# Patient Record
Sex: Female | Born: 1965
Health system: Southern US, Community
[De-identification: ages and names within clinical notes are randomized; demographics above are authoritative.]

## PROBLEM LIST (undated history)

## (undated) DIAGNOSIS — I10 Essential (primary) hypertension: Secondary | ICD-10-CM

## (undated) DIAGNOSIS — E559 Vitamin D deficiency, unspecified: Secondary | ICD-10-CM

## (undated) DIAGNOSIS — F430 Acute stress reaction: Secondary | ICD-10-CM

## (undated) DIAGNOSIS — K5730 Diverticulosis of large intestine without perforation or abscess without bleeding: Secondary | ICD-10-CM

## (undated) DIAGNOSIS — F419 Anxiety disorder, unspecified: Secondary | ICD-10-CM

## (undated) DIAGNOSIS — E7800 Pure hypercholesterolemia, unspecified: Secondary | ICD-10-CM

## (undated) DIAGNOSIS — M5430 Sciatica, unspecified side: Secondary | ICD-10-CM

## (undated) HISTORY — DX: Pure hypercholesterolemia, unspecified: E78.00

## (undated) HISTORY — DX: Vitamin D deficiency, unspecified: E55.9

## (undated) HISTORY — DX: Sciatica, unspecified side: M54.30

## (undated) HISTORY — DX: Acute stress reaction: F43.0

## (undated) HISTORY — DX: Essential (primary) hypertension: I10

## (undated) HISTORY — DX: Diverticulosis of large intestine without perforation or abscess without bleeding: K57.30

## (undated) HISTORY — DX: Anxiety disorder, unspecified: F41.9

---

## 2014-03-24 ENCOUNTER — Other Ambulatory Visit: Payer: Self-pay | Admitting: Obstetrics and Gynecology

## 2014-03-24 DIAGNOSIS — Z1231 Encounter for screening mammogram for malignant neoplasm of breast: Secondary | ICD-10-CM

## 2014-04-21 ENCOUNTER — Encounter (INDEPENDENT_AMBULATORY_CARE_PROVIDER_SITE_OTHER): Payer: Self-pay

## 2014-04-21 ENCOUNTER — Ambulatory Visit
Admission: RE | Admit: 2014-04-21 | Discharge: 2014-04-21 | Disposition: A | Discharge status: Home / Self Care | Payer: BC Managed Care – PPO | Source: Ambulatory Visit | Attending: Obstetrics and Gynecology | Admitting: Obstetrics and Gynecology

## 2014-04-21 DIAGNOSIS — Z1231 Encounter for screening mammogram for malignant neoplasm of breast: Secondary | ICD-10-CM

## 2017-08-22 DIAGNOSIS — D3132 Benign neoplasm of left choroid: Secondary | ICD-10-CM | POA: Diagnosis not present

## 2017-10-10 DIAGNOSIS — Z719 Counseling, unspecified: Secondary | ICD-10-CM | POA: Diagnosis not present

## 2018-01-20 DIAGNOSIS — Z719 Counseling, unspecified: Secondary | ICD-10-CM | POA: Diagnosis not present

## 2018-01-27 DIAGNOSIS — Z719 Counseling, unspecified: Secondary | ICD-10-CM | POA: Diagnosis not present

## 2018-02-03 DIAGNOSIS — Z719 Counseling, unspecified: Secondary | ICD-10-CM | POA: Diagnosis not present

## 2018-04-03 ENCOUNTER — Other Ambulatory Visit: Payer: Self-pay | Admitting: Family Medicine

## 2018-04-03 DIAGNOSIS — E559 Vitamin D deficiency, unspecified: Secondary | ICD-10-CM | POA: Diagnosis not present

## 2018-04-03 DIAGNOSIS — Z1231 Encounter for screening mammogram for malignant neoplasm of breast: Secondary | ICD-10-CM

## 2018-04-03 DIAGNOSIS — Z0000 Encounter for general adult medical examination without abnormal findings: Secondary | ICD-10-CM | POA: Diagnosis not present

## 2018-04-03 DIAGNOSIS — I10 Essential (primary) hypertension: Secondary | ICD-10-CM | POA: Diagnosis not present

## 2018-04-20 DIAGNOSIS — M545 Low back pain: Secondary | ICD-10-CM | POA: Diagnosis not present

## 2018-04-20 DIAGNOSIS — M79605 Pain in left leg: Secondary | ICD-10-CM | POA: Diagnosis not present

## 2018-04-20 DIAGNOSIS — M6281 Muscle weakness (generalized): Secondary | ICD-10-CM | POA: Diagnosis not present

## 2018-04-22 DIAGNOSIS — M79605 Pain in left leg: Secondary | ICD-10-CM | POA: Diagnosis not present

## 2018-04-22 DIAGNOSIS — M545 Low back pain: Secondary | ICD-10-CM | POA: Diagnosis not present

## 2018-04-22 DIAGNOSIS — M6281 Muscle weakness (generalized): Secondary | ICD-10-CM | POA: Diagnosis not present

## 2018-04-29 DIAGNOSIS — M545 Low back pain: Secondary | ICD-10-CM | POA: Diagnosis not present

## 2018-04-29 DIAGNOSIS — M6281 Muscle weakness (generalized): Secondary | ICD-10-CM | POA: Diagnosis not present

## 2018-04-29 DIAGNOSIS — M79605 Pain in left leg: Secondary | ICD-10-CM | POA: Diagnosis not present

## 2018-05-04 ENCOUNTER — Ambulatory Visit
Admission: RE | Admit: 2018-05-04 | Discharge: 2018-05-04 | Disposition: A | Discharge status: Home / Self Care | Payer: 59 | Source: Ambulatory Visit | Attending: Family Medicine | Admitting: Family Medicine

## 2018-05-04 DIAGNOSIS — Z1231 Encounter for screening mammogram for malignant neoplasm of breast: Secondary | ICD-10-CM | POA: Diagnosis not present

## 2018-05-06 DIAGNOSIS — M6281 Muscle weakness (generalized): Secondary | ICD-10-CM | POA: Diagnosis not present

## 2018-05-06 DIAGNOSIS — M79605 Pain in left leg: Secondary | ICD-10-CM | POA: Diagnosis not present

## 2018-05-06 DIAGNOSIS — M545 Low back pain: Secondary | ICD-10-CM | POA: Diagnosis not present

## 2018-05-08 DIAGNOSIS — M79605 Pain in left leg: Secondary | ICD-10-CM | POA: Diagnosis not present

## 2018-05-08 DIAGNOSIS — M545 Low back pain: Secondary | ICD-10-CM | POA: Diagnosis not present

## 2018-05-08 DIAGNOSIS — M6281 Muscle weakness (generalized): Secondary | ICD-10-CM | POA: Diagnosis not present

## 2018-10-20 DIAGNOSIS — M5432 Sciatica, left side: Secondary | ICD-10-CM | POA: Diagnosis not present

## 2018-10-20 DIAGNOSIS — M9903 Segmental and somatic dysfunction of lumbar region: Secondary | ICD-10-CM | POA: Diagnosis not present

## 2018-10-21 DIAGNOSIS — M9903 Segmental and somatic dysfunction of lumbar region: Secondary | ICD-10-CM | POA: Diagnosis not present

## 2018-10-21 DIAGNOSIS — M5432 Sciatica, left side: Secondary | ICD-10-CM | POA: Diagnosis not present

## 2018-10-26 DIAGNOSIS — M5432 Sciatica, left side: Secondary | ICD-10-CM | POA: Diagnosis not present

## 2018-10-26 DIAGNOSIS — M9903 Segmental and somatic dysfunction of lumbar region: Secondary | ICD-10-CM | POA: Diagnosis not present

## 2018-10-28 DIAGNOSIS — M5432 Sciatica, left side: Secondary | ICD-10-CM | POA: Diagnosis not present

## 2018-10-28 DIAGNOSIS — M9903 Segmental and somatic dysfunction of lumbar region: Secondary | ICD-10-CM | POA: Diagnosis not present

## 2018-10-29 DIAGNOSIS — M9903 Segmental and somatic dysfunction of lumbar region: Secondary | ICD-10-CM | POA: Diagnosis not present

## 2018-10-29 DIAGNOSIS — M5432 Sciatica, left side: Secondary | ICD-10-CM | POA: Diagnosis not present

## 2019-05-24 ENCOUNTER — Other Ambulatory Visit: Payer: Self-pay | Admitting: Family Medicine

## 2019-05-24 DIAGNOSIS — Z1231 Encounter for screening mammogram for malignant neoplasm of breast: Secondary | ICD-10-CM

## 2019-07-06 ENCOUNTER — Other Ambulatory Visit: Payer: Self-pay

## 2019-07-06 ENCOUNTER — Ambulatory Visit
Admission: RE | Admit: 2019-07-06 | Discharge: 2019-07-06 | Disposition: A | Discharge status: Home / Self Care | Payer: 59 | Source: Ambulatory Visit | Attending: Family Medicine | Admitting: Family Medicine

## 2019-07-06 DIAGNOSIS — Z1231 Encounter for screening mammogram for malignant neoplasm of breast: Secondary | ICD-10-CM

## 2019-07-20 ENCOUNTER — Other Ambulatory Visit: Payer: Self-pay | Admitting: Nurse Practitioner

## 2019-07-20 ENCOUNTER — Ambulatory Visit
Admission: RE | Admit: 2019-07-20 | Discharge: 2019-07-20 | Disposition: A | Discharge status: Home / Self Care | Payer: No Typology Code available for payment source | Source: Ambulatory Visit | Attending: Nurse Practitioner | Admitting: Nurse Practitioner

## 2019-07-20 ENCOUNTER — Other Ambulatory Visit: Payer: Self-pay

## 2019-07-20 DIAGNOSIS — R609 Edema, unspecified: Secondary | ICD-10-CM

## 2019-07-20 DIAGNOSIS — R52 Pain, unspecified: Secondary | ICD-10-CM

## 2019-07-20 DIAGNOSIS — W19XXXA Unspecified fall, initial encounter: Secondary | ICD-10-CM

## 2019-09-08 ENCOUNTER — Ambulatory Visit: Payer: 59 | Attending: Internal Medicine

## 2019-09-08 DIAGNOSIS — Z20822 Contact with and (suspected) exposure to covid-19: Secondary | ICD-10-CM

## 2019-09-09 LAB — NOVEL CORONAVIRUS, NAA: SARS-CoV-2, NAA: NOT DETECTED

## 2021-06-09 IMAGING — CR DG KNEE COMPLETE 4+V*L*
4 series · 4 of 4 positions shown · non-contrast
Comparison: None.

CLINICAL DATA: Recent fall with left knee pain, initial encounter

EXAM:
LEFT KNEE - COMPLETE 4+ VIEW

[w knee ap left]
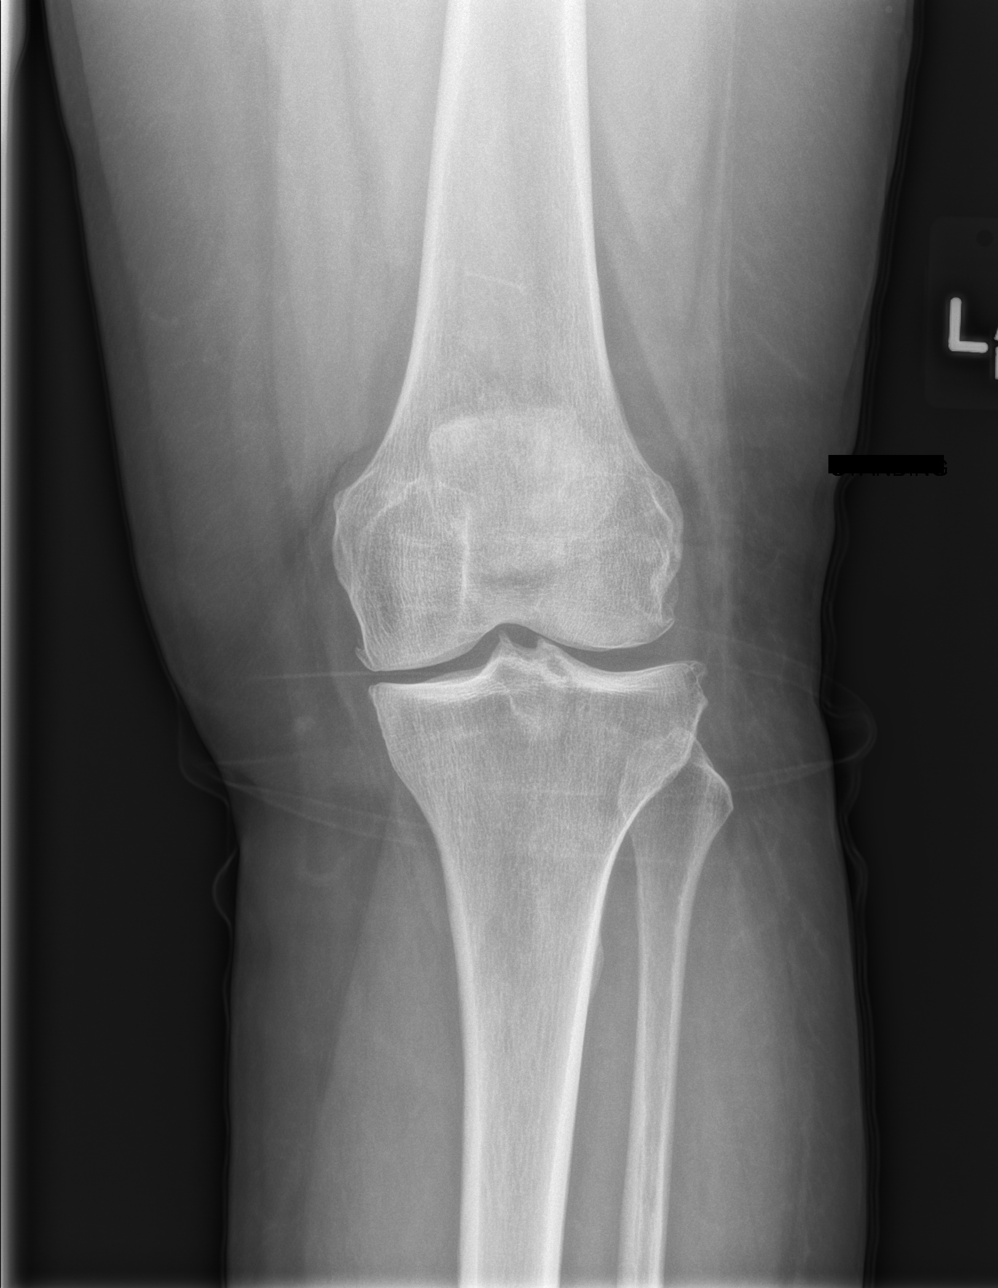

[w knee lat left (1 of 2)]
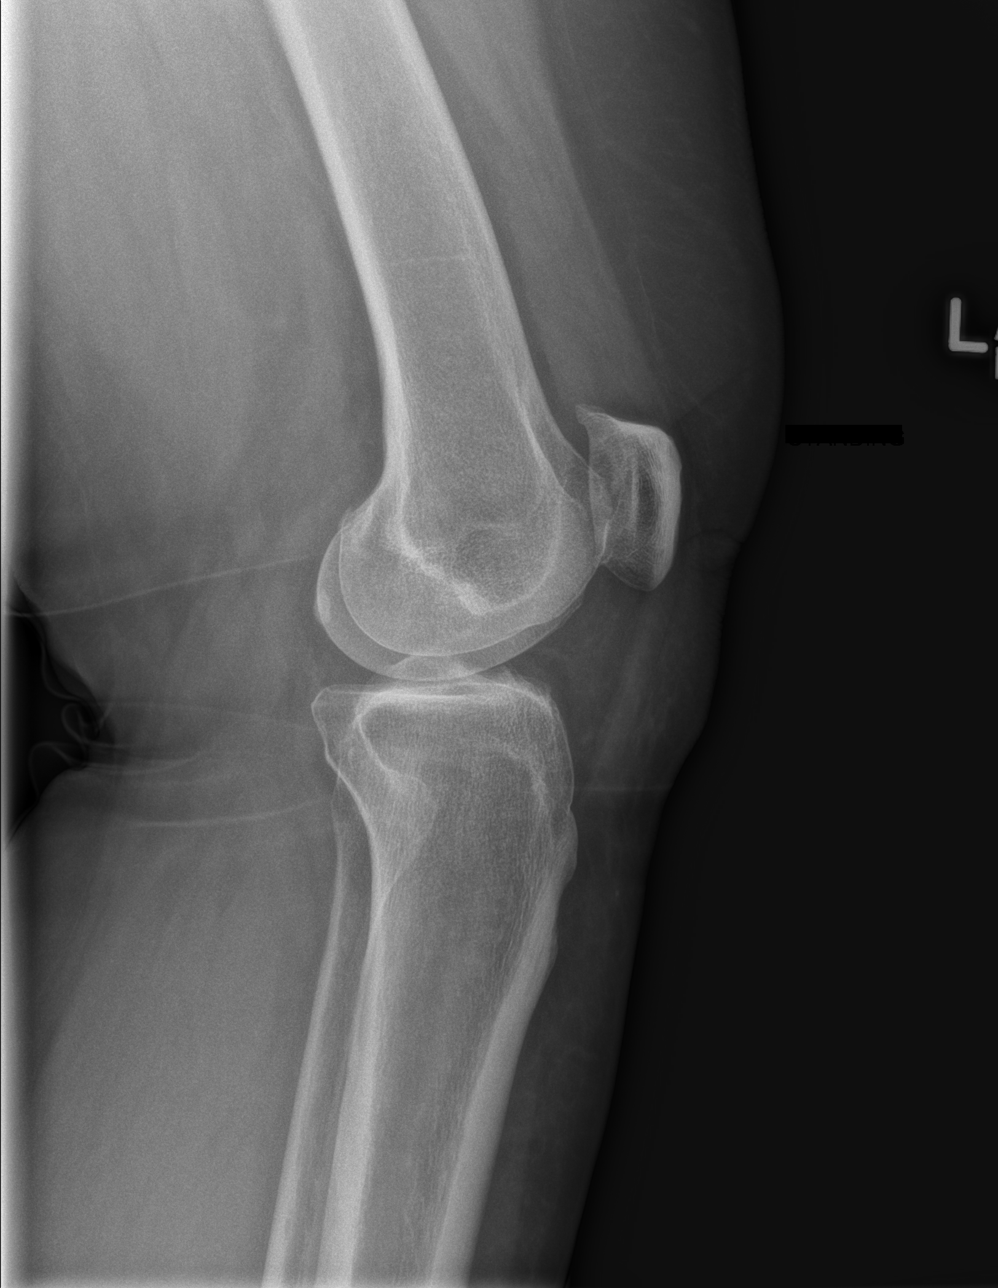

[w knee lat left (2 of 2)]
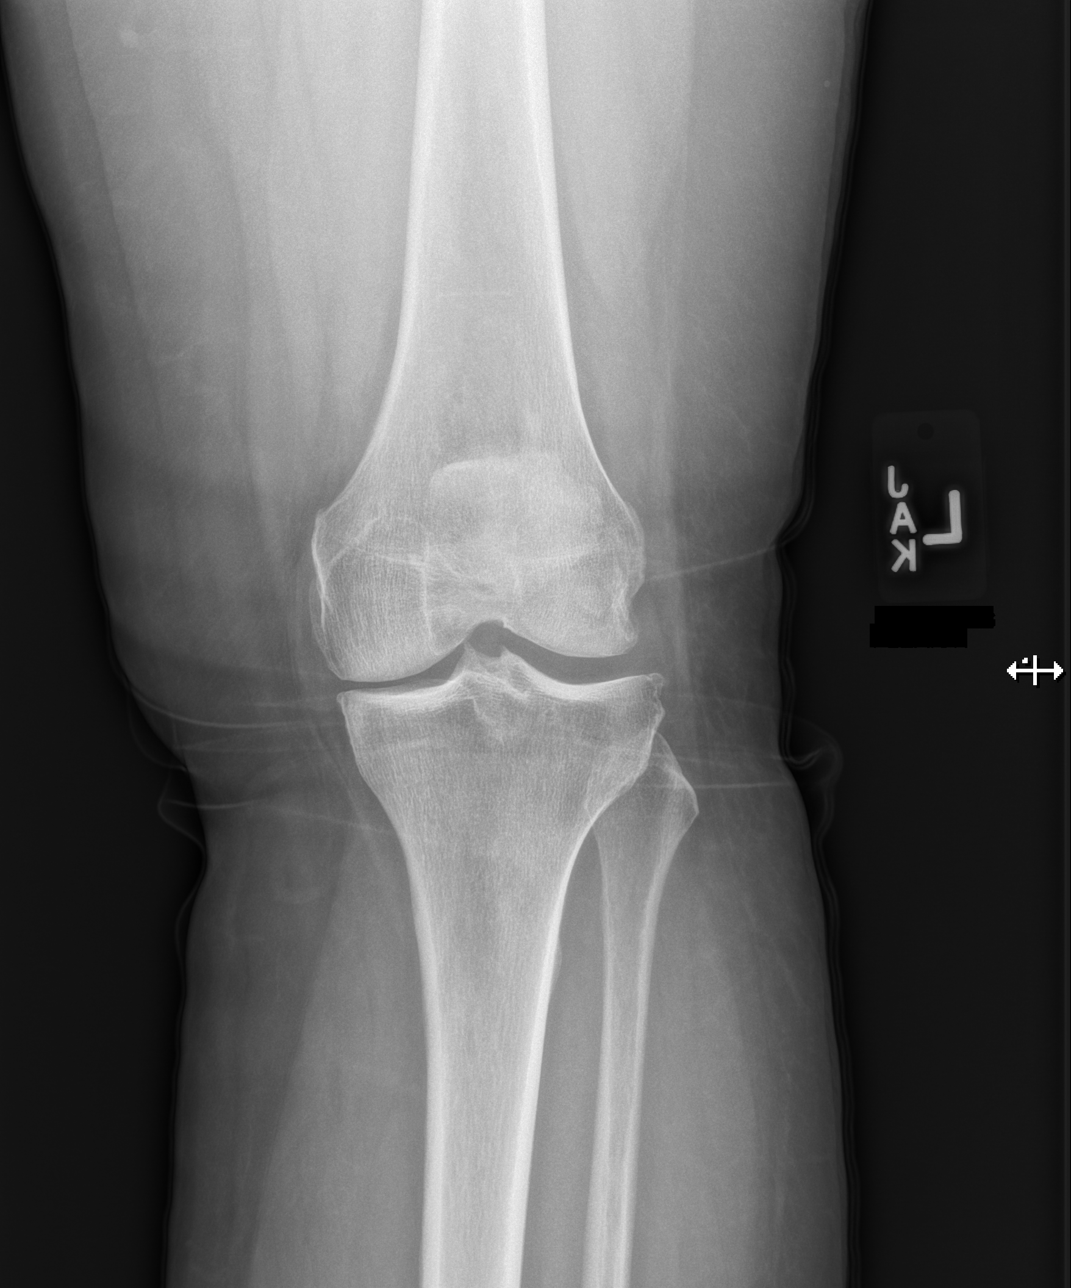

[x knee sunrise left]
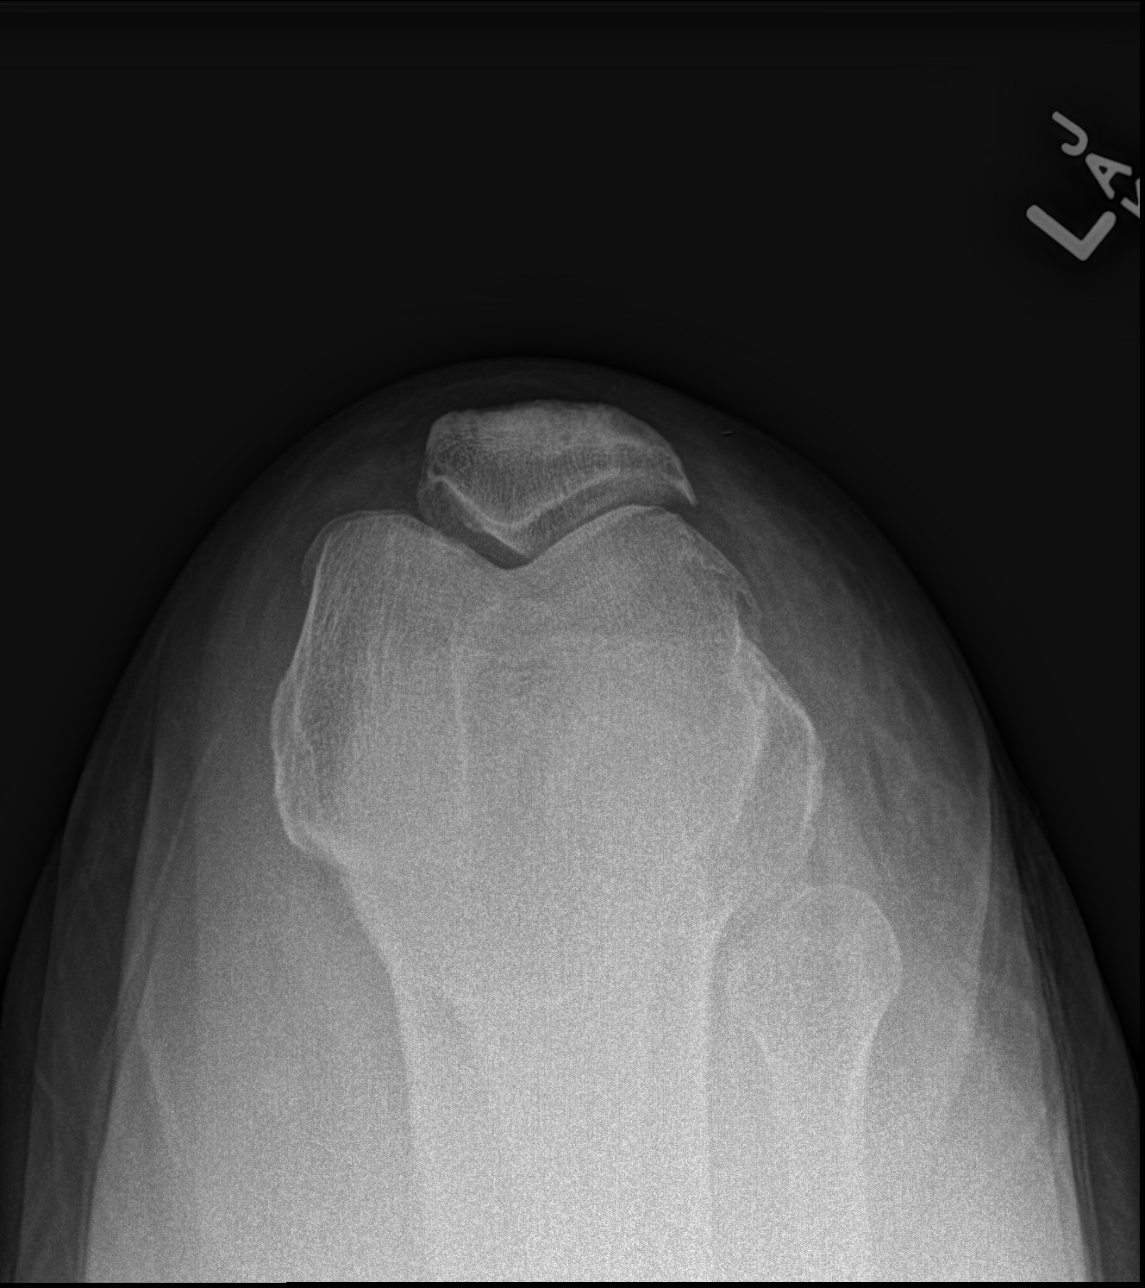

[4 of 4 positions shown; findings below may reference images not displayed]

FINDINGS: Mild tricompartmental degenerative changes are noted worst in the
medial joint space. No acute fracture or dislocation is seen. No
soft tissue abnormality is noted.
IMPRESSION: Mild degenerative change without acute abnormality.

## 2021-09-03 NOTE — Progress Notes (Signed)
Cardiology Office Note:    Date:  09/05/2021   ID:  Nancy Chan, DOB 1966/07/13, MRN [id number]  PCP:  Nancy Arabian, MD   [facility] Providers Cardiologist:  Werner Lean, MD     Referring MD: Nancy Arabian, MD   CC: questions about BP Consulted for the evaluation of HTN at the behest of Nancy Arabian, MD  History of Present Illness:    Nancy Chan is a 56 y.o. female with a hx of HTN and HLD Seen 09/05/21.  Patient notes that she is feeling ok.  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  Is renovating her house fixing the kitchen  and bathroom.  Has had no chest pain, chest pressure, chest tightness, chest stinging . No shortness of breath, but DOE with stairs.  No PND or orthopnea.  Notes weight gain, leg swelling , and abdominal swelling.  No syncope or near syncope . Notes no palpitations or funny heart beats.     Attempting to drink more water, decrease salt, and fatty foods. Made these changes in the last 6 months. Patient reports prior cardiac testing including   No history of pre-eclampsia, gestation HTN or gestational DM.   Ambulatory BP 127/77  on new medications.   Past Medical History:  Diagnosis Date   Anxiety    Diverticulosis of colon    HTN (hypertension)    Hypercholesterolemia    Sciatica    Stress reaction    Vitamin D deficiency     No past surgical history on file.  Current Medications: Current Meds  Medication Sig   amLODipine (NORVASC) 5 MG tablet Take 5 mg by mouth daily.   atorvastatin (LIPITOR) 10 MG tablet Take 10 mg by mouth daily.   ECHINACEA COMPLEX PO Take by mouth.   Milk Thistle 1000 MG CAPS daily.   valsartan-hydrochlorothiazide (DIOVAN-HCT) 320-25 MG tablet Take 1 tablet by mouth daily.     Allergies:   Rosuvastatin   Social History   Socioeconomic History   Marital status: Single    Spouse name: Not on file   Number of children: Not on file   Years of education: Not on file    Highest education level: Not on file  Occupational History   Not on file  Tobacco Use   Smoking status: Never   Smokeless tobacco: Never  Substance and Sexual Activity   Alcohol use: Yes   Drug use: Never   Sexual activity: Not on file  Other Topics Concern   Not on file  Social History Narrative   Not on file   Social Determinants of Health   Financial Resource Strain: Not on file  Food Insecurity: Not on file  Transportation Needs: Not on file  Physical Activity: Not on file  Stress: Not on file  Social Connections: Not on file     Family History: The patient's family history includes Heart disease (age of onset: 71) in her father; Heart failure in her mother. History of coronary artery disease notable for no members . History of heart failure notable for father.   ROS:   Please see the history of present illness.     All other systems reviewed and are negative.  EKGs/Labs/Other Studies Reviewed:    The following studies were reviewed today:   EKG:  EKG is  ordered today.  The ekg ordered today demonstrates  09/05/21: SR rate 80 LVH  Recent Labs: No results found for requested labs within last  8760 hours.  Recent Lipid Panel No results found for: CHOL, TRIG, HDL, CHOLHDL, VLDL, LDLCALC, LDLDIRECT       Physical Exam:    VS:  BP (!) 151/100    Pulse 80    Ht 5\' 7"  (1.702 m)    Wt 88.9 kg    SpO2 98%    BMI 30.70 kg/m     Wt Readings from Last 3 Encounters:  09/05/21 88.9 kg     Gen: no distress,    Neck: No JVD  Cardiac: No Rubs or Gallops, Systolic Murmur worse with standing, regular rate +2 radial pulses Respiratory: Clear to auscultation bilaterally, normal effort, normal  respiratory rate GI: Soft, nontender, non-distended  MS: No  edema;  moves all extremities Integument: Skin feels warm Neuro:  At time of evaluation, alert and oriented to person/place/time/situation  Psych: Normal affect, patient feels ok   ASSESSMENT:    1. Hypertension,  unspecified type   2. LVH (left ventricular hypertrophy)   3. Bilateral lower extremity edema   4. Familial hypercholesterolemia    PLAN:    Hypertension Systolic murmur and LVH Daytime somnolence STOPBANG 4 FH - Aldactone/Renin - Sleep study - Plasma metanephrines - TSH was normal - continue current medications, and amb BP monitoring, next medication would be aldactone 25 mg PO daily - has follow up labs on atorvastatin but will need higher statin does (had rosuvastatin myalgias) - getting echo  Three months f/u me or APP       Medication Adjustments/Labs and Tests Ordered: Current medicines are reviewed at length with the patient today.  Concerns regarding medicines are outlined above.  Orders Placed This Encounter  Procedures   Aldosterone + renin activity w/ ratio   Metanephrines, plasma   EKG 12-Lead   ECHOCARDIOGRAM COMPLETE   [place] Sleep Study   No orders of the defined types were placed in this encounter.   Patient Instructions  Medication Instructions:  Your physician recommends that you continue on your current medications as directed. Please refer to the Current Medication list given to you today.  *If you need a refill on your cardiac medications before your next appointment, please call your pharmacy*   Lab Work: TODAY: aldosterone + renin   If you have labs (blood work) drawn today and your tests are completely normal, you will receive your results only by: [place] (if you have MyChart) OR A paper copy in the mail If you have any lab test that is abnormal or we need to change your treatment, we will call you to review the results.   Testing/Procedures: Your physician has recommended that you have a home sleep study. This test records several body functions during sleep, including: brain activity, eye movement, oxygen and carbon dioxide blood levels, heart rate and rhythm, breathing rate and rhythm, the flow of air through your mouth and  nose, snoring, body muscle movements, and chest and belly movement.   Your physician has requested that you have an echocardiogram. Echocardiography is a painless test that uses sound waves to create images of your heart. It provides your doctor with information about the size and shape of your heart and how well your hearts chambers and valves are working. This procedure takes approximately one hour. There are no restrictions for this procedure.    Follow-Up: At [facility], you and your health needs are our priority.  As part of our continuing mission to provide you with exceptional heart care, we have created designated  Provider Care Teams.  These Care Teams include your primary Cardiologist (physician) and Advanced Practice Providers (APPs -  Physician Assistants and Nurse Practitioners) who all work together to provide you with the care you need, when you need it.   Your next appointment:   3- 4  month(s)  The format for your next appointment:   In Person  Provider:   Werner Lean, MD {      Signed, Werner Lean, MD  09/05/2021 5:18 PM    [place]

## 2021-09-05 ENCOUNTER — Ambulatory Visit: Payer: 59 | Admitting: Internal Medicine

## 2021-09-05 ENCOUNTER — Telehealth: Payer: Self-pay | Admitting: *Deleted

## 2021-09-05 ENCOUNTER — Encounter: Payer: Self-pay | Admitting: Internal Medicine

## 2021-09-05 ENCOUNTER — Other Ambulatory Visit: Payer: Self-pay

## 2021-09-05 VITALS — BP 151/100 | HR 80 | Ht 67.0 in | Wt 196.0 lb

## 2021-09-05 DIAGNOSIS — I10 Essential (primary) hypertension: Secondary | ICD-10-CM | POA: Insufficient documentation

## 2021-09-05 DIAGNOSIS — I517 Cardiomegaly: Secondary | ICD-10-CM | POA: Diagnosis not present

## 2021-09-05 DIAGNOSIS — R600 Localized edema: Secondary | ICD-10-CM | POA: Diagnosis not present

## 2021-09-05 DIAGNOSIS — E7801 Familial hypercholesterolemia: Secondary | ICD-10-CM

## 2021-09-05 NOTE — Telephone Encounter (Signed)
Pt was in the office today to see Dr. Gasper Sells who ordered an [place] sleep study for the pt. I have set pt up sleep study and have registered the pt on sleep site. Pt aware once study completed and ready by the cardiologist we will call with results. Pt aware I will call with PIN# once I have authorization from insurance company. Pt thanked me for the help today.

## 2021-09-05 NOTE — Patient Instructions (Addendum)
Medication Instructions:  Your physician recommends that you continue on your current medications as directed. Please refer to the Current Medication list given to you today.  *If you need a refill on your cardiac medications before your next appointment, please call your pharmacy*   Lab Work: TODAY: aldosterone + renin   If you have labs (blood work) drawn today and your tests are completely normal, you will receive your results only by: [place] (if you have MyChart) OR A paper copy in the mail If you have any lab test that is abnormal or we need to change your treatment, we will call you to review the results.   Testing/Procedures: Your physician has recommended that you have a home sleep study. This test records several body functions during sleep, including: brain activity, eye movement, oxygen and carbon dioxide blood levels, heart rate and rhythm, breathing rate and rhythm, the flow of air through your mouth and nose, snoring, body muscle movements, and chest and belly movement.   Your physician has requested that you have an echocardiogram. Echocardiography is a painless test that uses sound waves to create images of your heart. It provides your doctor with information about the size and shape of your heart and how well your hearts chambers and valves are working. This procedure takes approximately one hour. There are no restrictions for this procedure.    Follow-Up: At [facility], you and your health needs are our priority.  As part of our continuing mission to provide you with exceptional heart care, we have created designated Provider Care Teams.  These Care Teams include your primary Cardiologist (physician) and Advanced Practice Providers (APPs -  Physician Assistants and Nurse Practitioners) who all work together to provide you with the care you need, when you need it.   Your next appointment:   3- 4  month(s)  The format for your next appointment:   In  Person  Provider:   Werner Lean, MD {

## 2021-09-10 LAB — ALDOSTERONE + RENIN ACTIVITY W/ RATIO
ALDOS/RENIN RATIO: 1.2 (ref 0.0–30.0)
ALDOSTERONE: 8.3 ng/dL (ref 0.0–30.0)
Renin: 7.025 ng/mL/hr — ABNORMAL HIGH (ref 0.167–5.380)

## 2021-09-12 ENCOUNTER — Telehealth: Payer: Self-pay | Admitting: Internal Medicine

## 2021-09-12 NOTE — Telephone Encounter (Signed)
Follow Up:      Patient is  returning Nancy Chan's  call from today, concerning her lab results.

## 2021-09-14 NOTE — Telephone Encounter (Signed)
Left a message to call back.

## 2021-09-17 ENCOUNTER — Ambulatory Visit (HOSPITAL_COMMUNITY): Payer: 59 | Attending: Cardiology

## 2021-09-17 ENCOUNTER — Other Ambulatory Visit: Payer: Self-pay

## 2021-09-17 DIAGNOSIS — I10 Essential (primary) hypertension: Secondary | ICD-10-CM | POA: Diagnosis not present

## 2021-09-17 DIAGNOSIS — R600 Localized edema: Secondary | ICD-10-CM | POA: Insufficient documentation

## 2021-09-17 DIAGNOSIS — I517 Cardiomegaly: Secondary | ICD-10-CM | POA: Diagnosis not present

## 2021-09-17 LAB — ECHOCARDIOGRAM COMPLETE
Area-P 1/2: 2.64 cm2
S' Lateral: 3.4 cm

## 2021-09-18 ENCOUNTER — Telehealth: Payer: Self-pay

## 2021-09-18 DIAGNOSIS — I10 Essential (primary) hypertension: Secondary | ICD-10-CM

## 2021-09-18 DIAGNOSIS — R600 Localized edema: Secondary | ICD-10-CM

## 2021-09-18 MED ORDER — SPIRONOLACTONE 25 MG PO TABS: 25.0000 mg | ORAL_TABLET | Freq: Every day | ORAL | 3 refills | Status: AC

## 2021-09-18 NOTE — Telephone Encounter (Signed)
Pt has been called results and MD recommendations reviewed.  Please see encounter from today 09/18/21.

## 2021-09-18 NOTE — Telephone Encounter (Signed)
Called pt reviewed results and MD recommendations.  Pt is agreeable to plan orders placed. Pt advised that renal artery duplex will be done at our [place] location and a scheduler will call to get appointment set up.  Pt had no questions or concerns.

## 2021-09-18 NOTE — Telephone Encounter (Signed)
-----   Message from Werner Lean, MD sent at 09/11/2021  5:07 PM EST ----- Results: Elevated renin with normal aldo/renin level Plan: Start aldactone 25 mg PO daily Labs in 7-10 days Bilateral renal artery duplex  Werner Lean, MD

## 2021-09-19 NOTE — Telephone Encounter (Signed)
Prior Authorization is not required for the requested services

## 2021-09-19 NOTE — Telephone Encounter (Signed)
Called the pt to give her the PIN # and ok to proceed with sleep study. Left message for the pt to call the office and ask to s/w [facility]/Home Sleep Studies.

## 2021-09-20 NOTE — Telephone Encounter (Signed)
Left message x 2 to give the pt the PIN#

## 2021-09-21 NOTE — Telephone Encounter (Signed)
Called and made the patient aware that she may proceed with the [facility] Sleep Study. PIN # [id number] provided to the patient. Patient made aware that she will be contacted after the test has been read with the results and any recommendations. Patient verbalized understanding and thanked me for the call.    Pt states she will do sleep study with in the next few days. Pt thanked me for the call today.

## 2021-10-02 ENCOUNTER — Other Ambulatory Visit: Payer: Self-pay

## 2021-10-02 ENCOUNTER — Other Ambulatory Visit: Payer: 59 | Admitting: *Deleted

## 2021-10-02 DIAGNOSIS — I10 Essential (primary) hypertension: Secondary | ICD-10-CM

## 2021-10-02 DIAGNOSIS — R600 Localized edema: Secondary | ICD-10-CM

## 2021-10-02 LAB — BASIC METABOLIC PANEL
BUN/Creatinine Ratio: 28 — ABNORMAL HIGH (ref 9–23)
BUN: 28 mg/dL — ABNORMAL HIGH (ref 6–24)
CO2: 26 mmol/L (ref 20–29)
Calcium: 9.9 mg/dL (ref 8.7–10.2)
Chloride: 100 mmol/L (ref 96–106)
Creatinine, Ser: 0.99 mg/dL (ref 0.57–1.00)
Glucose: 93 mg/dL (ref 70–99)
Potassium: 4.7 mmol/L (ref 3.5–5.2)
Sodium: 138 mmol/L (ref 134–144)
eGFR: 67 mL/min/{1.73_m2} (ref >59)

## 2021-10-03 ENCOUNTER — Encounter: Payer: Self-pay | Admitting: Internal Medicine

## 2021-10-04 ENCOUNTER — Encounter (INDEPENDENT_AMBULATORY_CARE_PROVIDER_SITE_OTHER): Payer: 59 | Admitting: Cardiology

## 2021-10-04 DIAGNOSIS — G4733 Obstructive sleep apnea (adult) (pediatric): Secondary | ICD-10-CM

## 2021-10-05 NOTE — Telephone Encounter (Signed)
Prior Authorization is not required  Decision ID #:[id number]

## 2021-10-05 NOTE — Procedures (Signed)
° °  Sleep Study Report  Patient Information Study Date: 10/04/21 Patient Name: Nancy Chan Patient ID: [id number] Birth Date: [birth year] Age: 56 Gender: Female BMI: 30.8 (W=196 lb, H=5' 7'') Neck Circ.: 38 '' Referring Physician: Rudean Haskell, MD  TEST DESCRIPTION: Home sleep apnea testing was completed using the WatchPat, a Type 1 device, utilizing peripheral arterial tonometry (PAT), chest movement, actigraphy, pulse oximetry, pulse rate, body position and snore. AHI was calculated with apnea and hypopnea using valid sleep time as the denominator. RDI includes apneas, hypopneas, and RERAs. The data acquired and the scoring of sleep and all associated events were performed in accordance with the recommended standards and specifications as outlined in the AASM Manual for the Scoring of Sleep and Associated Events 2.2.0 (2015).  FINDINGS: 1. Moderate Obstructive Sleep Apnea with AHI 22.1/hr. 2. No Central Sleep Apnea with pAHIc 1.6/hr. 3. Oxygen desaturations as low as 81%. 4. Mild snoring was present. O2 sats were < 88% for 1.1 min. 5. Total sleep time was 6 hrs and 8 min. 6. 28.9% of total sleep time was spent in REM sleep. 7. Normal sleep onset latency at19 min 8. Shortened REM sleep onset latency at 43 min. 9. Total awakenings were 3.  DIAGNOSIS: Moderate Obstructive Sleep Apnea (G47.33)  RECOMMENDATIONS: 1. Clinical correlation of these findings is necessary. The decision to treat obstructive sleep apnea (OSA) is usually based on the presence of apnea symptoms or the presence of associated medical conditions such as Hypertension, Congestive Heart Failure, Atrial Fibrillation or Obesity. The most common symptoms of OSA are snoring, gasping for breath while sleeping, daytime sleepiness and fatigue.  2. Initiating apnea therapy is recommended given the presence of symptoms and/or associated conditions. Recommend proceeding with one of the following:   a. Auto-CPAP  therapy with a pressure range of 5-20cm H2O.   b. An oral appliance (OA) that can be obtained from certain dentists with expertise in sleep medicine. These are primarily of use in non-obese patients with mild and moderate disease.   c. An ENT consultation which may be useful to look for specific causes of obstruction and possible treatment options.   d. If patient is intolerant to PAP therapy, consider referral to ENT for evaluation for hypoglossal nerve stimulator.  3. Close follow-up is necessary to ensure success with CPAP or oral appliance therapy for maximum benefit .  4. A follow-up oximetry study on CPAP is recommended to assess the adequacy of therapy and determine the need for supplemental oxygen or the potential need for Bi-level therapy. An arterial blood gas to determine the adequacy of baseline ventilation and oxygenation should also be considered.  5. Healthy sleep recommendations include: adequate nightly sleep (normal 7-9 hrs/night), avoidance of caffeine after noon and alcohol near bedtime, and maintaining a sleep environment that is cool, dark and quiet.  6. Weight loss for overweight patients is recommended. Even modest amounts of weight loss can significantly improve the severity of sleep apnea.  7. Snoring recommendations include: weight loss where appropriate, side sleeping, and avoidance of alcohol before bed.  8. Operation of motor vehicle should not be performed when sleepy.  Signature: Electronically Signed: 10/05/21 Fransico Him, MD; [facility]; [place], American Board of Sleep Medicine

## 2021-10-08 ENCOUNTER — Ambulatory Visit: Payer: 59

## 2021-10-08 ENCOUNTER — Telehealth: Payer: Self-pay | Admitting: *Deleted

## 2021-10-08 ENCOUNTER — Encounter: Payer: Self-pay | Admitting: Internal Medicine

## 2021-10-08 DIAGNOSIS — R600 Localized edema: Secondary | ICD-10-CM

## 2021-10-08 DIAGNOSIS — G4733 Obstructive sleep apnea (adult) (pediatric): Secondary | ICD-10-CM

## 2021-10-08 DIAGNOSIS — I517 Cardiomegaly: Secondary | ICD-10-CM

## 2021-10-08 NOTE — Telephone Encounter (Signed)
-----   Message from Lauralee Evener, [place] sent at 10/05/2021  2:44 PM EST -----  ----- Message ----- From: Sueanne Margarita, MD Sent: 10/05/2021  10:18 AM EST To: [facility] Sleep Studies  Please let patient know that they have sleep apnea.  Recommend therapeutic CPAP titration for treatment of patient's sleep disordered breathing.  If unable to perform an in lab titration then initiate ResMed auto CPAP from 4 to 15cm H2O with heated humidity and mask of choice and overnight pulse ox on CPAP.

## 2021-10-08 NOTE — Telephone Encounter (Signed)
PENDING AUTH# [id number].

## 2021-10-09 NOTE — Telephone Encounter (Signed)
Good morning I had left a message for patient. No return call as of 0:62 pm on [phone] [place] Billing

## 2021-10-10 NOTE — Telephone Encounter (Signed)
Denied PEER TO PEER required. ?

## 2021-10-10 NOTE — Telephone Encounter (Signed)
The patient has been notified to call about her result. Left detailed message on voicemail and informed patient to call back. Nancy Chan, [place] 10/10/2021 9:03 AM   ?

## 2021-10-29 NOTE — Addendum Note (Signed)
Addended by: Freada Bergeron on: 10/29/2021 05:45 PM ? ? Modules accepted: Orders ? ?

## 2021-10-29 NOTE — Telephone Encounter (Addendum)
If unable to perform an in lab titration then initiate ResMed auto CPAP from 4 to 15cm H2O with heated humidity and mask of choice and overnight pulse ox on CPAP.    ? ?Upon patient request DME selection is [facility] ?Patient understands he will be contacted by [facility] to set up his cpap. ?Patient understands to call if [place] does not contact him with new setup in a timely manner. ?Patient understands they will be called once confirmation has been received from [facility]/ that they have received their new machine to schedule 10 week follow up appointment. ?  ?[facility] notified of new cpap order  ?Please add to airview ?Patient was grateful for the call and thanked me.  ?

## 2021-11-13 ENCOUNTER — Encounter: Payer: Self-pay | Admitting: Internal Medicine

## 2021-11-29 ENCOUNTER — Ambulatory Visit: Payer: 59 | Admitting: Internal Medicine

## 2024-04-15 ENCOUNTER — Other Ambulatory Visit: Payer: Self-pay | Admitting: Family Medicine

## 2024-04-15 DIAGNOSIS — Z1231 Encounter for screening mammogram for malignant neoplasm of breast: Secondary | ICD-10-CM

## 2024-07-27 ENCOUNTER — Telehealth: Payer: Self-pay

## 2024-07-27 NOTE — Telephone Encounter (Signed)
 Received vm from patient requesting copy of medical records. I do not see where patient treated at our office. IC patient,lmvm advised for copy of  medical records to go on their website to obtain their release of records form.
